# Patient Record
Sex: Male | Born: 1950 | Race: White | Hispanic: No | Marital: Married
Health system: Southern US, Community
[De-identification: ages and names within clinical notes are randomized; demographics above are authoritative.]

---

## 2001-04-05 ENCOUNTER — Encounter: Payer: Self-pay | Admitting: *Deleted

## 2001-04-05 ENCOUNTER — Emergency Department (HOSPITAL_COMMUNITY): Admission: EM | Admit: 2001-04-05 | Discharge: 2001-04-05 | Payer: Self-pay | Admitting: *Deleted

## 2001-04-18 ENCOUNTER — Emergency Department (HOSPITAL_COMMUNITY): Admission: EM | Admit: 2001-04-18 | Discharge: 2001-04-18 | Payer: Self-pay | Admitting: Emergency Medicine

## 2001-04-20 ENCOUNTER — Encounter: Payer: Self-pay | Admitting: Emergency Medicine

## 2001-04-20 ENCOUNTER — Ambulatory Visit (HOSPITAL_COMMUNITY): Admission: RE | Admit: 2001-04-20 | Discharge: 2001-04-20 | Payer: Self-pay | Admitting: Emergency Medicine

## 2001-05-13 ENCOUNTER — Observation Stay (HOSPITAL_COMMUNITY): Admission: RE | Admit: 2001-05-13 | Discharge: 2001-05-14 | Payer: Self-pay | Admitting: *Deleted

## 2016-09-06 ENCOUNTER — Other Ambulatory Visit: Payer: Self-pay | Admitting: Gastroenterology

## 2016-10-15 ENCOUNTER — Encounter (HOSPITAL_COMMUNITY): Payer: Self-pay

## 2016-10-15 ENCOUNTER — Ambulatory Visit (HOSPITAL_COMMUNITY): Admit: 2016-10-15 | Payer: Self-pay | Admitting: Gastroenterology

## 2016-10-15 SURGERY — COLONOSCOPY WITH PROPOFOL
Anesthesia: Monitor Anesthesia Care

## 2017-06-20 ENCOUNTER — Encounter: Payer: Self-pay | Admitting: *Deleted

## 2017-06-20 ENCOUNTER — Other Ambulatory Visit: Payer: Self-pay | Admitting: *Deleted

## 2017-06-20 DIAGNOSIS — E119 Type 2 diabetes mellitus without complications: Secondary | ICD-10-CM | POA: Insufficient documentation

## 2017-06-20 NOTE — Patient Outreach (Signed)
[facility] Screening call, unsuccessful but left a message for a return call. If I do not hear back from the member I will try again the week of Sept. 24th.  Eulah Pont. Myrtie Neither, MSN, [facility] Gerontological Nurse Practitioner [facility] Care Management [phone]

## 2017-06-20 NOTE — Patient Outreach (Signed)
Pt returned my call. He sees Dr. Delfina Redwood. Has had his CPE this year with prior insurance and will have follow up with Dr. Delfina Redwood in October. No current care management needs. Sent introductory letter.  Eulah Pont. Myrtie Neither, MSN, [facility] Gerontological Nurse Practitioner [facility] Care Management [phone]

## 2017-07-01 DIAGNOSIS — R109 Unspecified abdominal pain: Secondary | ICD-10-CM | POA: Diagnosis not present

## 2017-08-01 DIAGNOSIS — M109 Gout, unspecified: Secondary | ICD-10-CM | POA: Diagnosis not present

## 2017-08-01 DIAGNOSIS — K635 Polyp of colon: Secondary | ICD-10-CM | POA: Diagnosis not present

## 2017-08-01 DIAGNOSIS — E1122 Type 2 diabetes mellitus with diabetic chronic kidney disease: Secondary | ICD-10-CM | POA: Diagnosis not present

## 2017-08-01 DIAGNOSIS — Z23 Encounter for immunization: Secondary | ICD-10-CM | POA: Diagnosis not present

## 2017-08-01 DIAGNOSIS — E7800 Pure hypercholesterolemia, unspecified: Secondary | ICD-10-CM | POA: Diagnosis not present

## 2017-10-24 DIAGNOSIS — K6289 Other specified diseases of anus and rectum: Secondary | ICD-10-CM | POA: Diagnosis not present

## 2017-10-24 DIAGNOSIS — D126 Benign neoplasm of colon, unspecified: Secondary | ICD-10-CM | POA: Diagnosis not present

## 2017-10-24 DIAGNOSIS — Z86010 Personal history of colonic polyps: Secondary | ICD-10-CM | POA: Diagnosis not present

## 2017-10-28 DIAGNOSIS — D126 Benign neoplasm of colon, unspecified: Secondary | ICD-10-CM | POA: Diagnosis not present

## 2018-02-20 DIAGNOSIS — Z0000 Encounter for general adult medical examination without abnormal findings: Secondary | ICD-10-CM | POA: Diagnosis not present

## 2018-02-20 DIAGNOSIS — E663 Overweight: Secondary | ICD-10-CM | POA: Diagnosis not present

## 2018-02-20 DIAGNOSIS — Z1389 Encounter for screening for other disorder: Secondary | ICD-10-CM | POA: Diagnosis not present

## 2018-02-20 DIAGNOSIS — R194 Change in bowel habit: Secondary | ICD-10-CM | POA: Diagnosis not present

## 2018-02-20 DIAGNOSIS — E7800 Pure hypercholesterolemia, unspecified: Secondary | ICD-10-CM | POA: Diagnosis not present

## 2018-02-20 DIAGNOSIS — E1122 Type 2 diabetes mellitus with diabetic chronic kidney disease: Secondary | ICD-10-CM | POA: Diagnosis not present

## 2018-02-20 DIAGNOSIS — M109 Gout, unspecified: Secondary | ICD-10-CM | POA: Diagnosis not present

## 2018-02-20 DIAGNOSIS — Z125 Encounter for screening for malignant neoplasm of prostate: Secondary | ICD-10-CM | POA: Diagnosis not present

## 2018-02-20 DIAGNOSIS — N182 Chronic kidney disease, stage 2 (mild): Secondary | ICD-10-CM | POA: Diagnosis not present

## 2018-03-31 ENCOUNTER — Emergency Department (HOSPITAL_COMMUNITY): Payer: PPO

## 2018-03-31 ENCOUNTER — Emergency Department (HOSPITAL_COMMUNITY)
Admission: EM | Admit: 2018-03-31 | Discharge: 2018-03-31 | Disposition: A | Discharge status: Home / Self Care | Payer: PPO | Attending: Emergency Medicine | Admitting: Emergency Medicine

## 2018-03-31 ENCOUNTER — Other Ambulatory Visit: Payer: Self-pay

## 2018-03-31 DIAGNOSIS — Z7984 Long term (current) use of oral hypoglycemic drugs: Secondary | ICD-10-CM | POA: Insufficient documentation

## 2018-03-31 DIAGNOSIS — N189 Chronic kidney disease, unspecified: Secondary | ICD-10-CM | POA: Diagnosis not present

## 2018-03-31 DIAGNOSIS — R112 Nausea with vomiting, unspecified: Secondary | ICD-10-CM | POA: Diagnosis not present

## 2018-03-31 DIAGNOSIS — Z7982 Long term (current) use of aspirin: Secondary | ICD-10-CM | POA: Insufficient documentation

## 2018-03-31 DIAGNOSIS — Z79899 Other long term (current) drug therapy: Secondary | ICD-10-CM | POA: Insufficient documentation

## 2018-03-31 DIAGNOSIS — R197 Diarrhea, unspecified: Secondary | ICD-10-CM | POA: Diagnosis not present

## 2018-03-31 DIAGNOSIS — R109 Unspecified abdominal pain: Secondary | ICD-10-CM | POA: Insufficient documentation

## 2018-03-31 DIAGNOSIS — N201 Calculus of ureter: Secondary | ICD-10-CM | POA: Diagnosis not present

## 2018-03-31 DIAGNOSIS — E1122 Type 2 diabetes mellitus with diabetic chronic kidney disease: Secondary | ICD-10-CM | POA: Diagnosis not present

## 2018-03-31 DIAGNOSIS — R1111 Vomiting without nausea: Secondary | ICD-10-CM | POA: Diagnosis not present

## 2018-03-31 LAB — CBC WITH DIFFERENTIAL/PLATELET
Abs Immature Granulocytes: 0.1 10*3/uL (ref 0.0–0.1)
Basophils Absolute: 0.1 10*3/uL (ref 0.0–0.1)
Basophils Relative: 1 %
EOS ABS: 0.2 10*3/uL (ref 0.0–0.7)
Eosinophils Relative: 2 %
HCT: 45.5 % (ref 39.0–52.0)
HEMOGLOBIN: 14.4 g/dL (ref 13.0–17.0)
Immature Granulocytes: 1 %
Lymphocytes Relative: 19 %
Lymphs Abs: 1.7 10*3/uL (ref 0.7–4.0)
MCH: 28 pg (ref 26.0–34.0)
MCHC: 31.6 g/dL (ref 30.0–36.0)
MCV: 88.3 fL (ref 78.0–100.0)
Monocytes Absolute: 0.6 10*3/uL (ref 0.1–1.0)
Monocytes Relative: 7 %
NEUTROS ABS: 6.4 10*3/uL (ref 1.7–7.7)
NEUTROS PCT: 70 %
Platelets: 273 10*3/uL (ref 150–400)
RBC: 5.15 MIL/uL (ref 4.22–5.81)
RDW: 13.7 % (ref 11.5–15.5)
WBC: 9 10*3/uL (ref 4.0–10.5)

## 2018-03-31 LAB — LIPASE, BLOOD: Lipase: 25 U/L (ref 11–51)

## 2018-03-31 LAB — URINALYSIS, ROUTINE W REFLEX MICROSCOPIC
Bacteria, UA: NONE SEEN
Bilirubin Urine: NEGATIVE
Glucose, UA: NEGATIVE mg/dL
KETONES UR: NEGATIVE mg/dL
Leukocytes, UA: NEGATIVE
Nitrite: NEGATIVE
PH: 5 (ref 5.0–8.0)
PROTEIN: NEGATIVE mg/dL
Specific Gravity, Urine: 1.021 (ref 1.005–1.030)

## 2018-03-31 LAB — BASIC METABOLIC PANEL
ANION GAP: 13 (ref 5–15)
BUN: 13 mg/dL (ref 8–23)
CALCIUM: 9.7 mg/dL (ref 8.9–10.3)
CO2: 25 mmol/L (ref 22–32)
Chloride: 103 mmol/L (ref 98–111)
Creatinine, Ser: 1.59 mg/dL — ABNORMAL HIGH (ref 0.61–1.24)
GFR, EST AFRICAN AMERICAN: 51 mL/min — AB (ref >60)
GFR, EST NON AFRICAN AMERICAN: 44 mL/min — AB (ref >60)
Glucose, Bld: 205 mg/dL — ABNORMAL HIGH (ref 70–99)
POTASSIUM: 3.7 mmol/L (ref 3.5–5.1)
Sodium: 141 mmol/L (ref 135–145)

## 2018-03-31 MED ORDER — ONDANSETRON 4 MG PO TBDP: 4.0000 mg | ORAL_TABLET | Freq: Three times a day (TID) | ORAL | 0 refills | Status: AC | PRN

## 2018-03-31 MED ORDER — TAMSULOSIN HCL 0.4 MG PO CAPS: 0.4000 mg | ORAL_CAPSULE | Freq: Every day | ORAL | 0 refills | Status: AC

## 2018-03-31 MED ORDER — OXYCODONE-ACETAMINOPHEN 5-325 MG PO TABS: 1.0000 | ORAL_TABLET | Freq: Four times a day (QID) | ORAL | 0 refills | Status: AC | PRN

## 2018-03-31 NOTE — ED Notes (Signed)
ED Provider at bedside. 

## 2018-03-31 NOTE — ED Provider Notes (Signed)
Patient placed in Quick Look pathway, seen and evaluated   Chief Complaint: Concern for food posioning  HPI:   Patient went to dinner, with wife, they are both sick, he had some diarrhea and some left sided flank pain.  He Reports feeling nauseous and has thrown up.    ROS: Flank pain has improved  Physical Exam:   Gen: No distress  Neuro: Awake and Alert  Skin: Warm    Focused Exam: Abdomen is soft, non tender, non distended.     Initiation of care has begun. The patient has been counseled on the process, plan, and necessity for staying for the completion/evaluation, and the remainder of the medical screening examination    Ollen Gross 03/31/18 2011    Hayden Rasmussen, MD 04/01/18 [phone]

## 2018-03-31 NOTE — ED Notes (Signed)
Discharge instructions/prescriptions reviewed with pt/family, family denied any other needs/requests

## 2018-03-31 NOTE — ED Provider Notes (Signed)
[facility] EMERGENCY DEPARTMENT Provider Note   CSN: [id number] Arrival date & time: 03/31/18  1952    History   Chief Complaint Chief Complaint  Patient presents with  . Flank Pain    HPI Carlos Henderson is a 67 y.o. male.  67 year old male with a history of CKD and DM (on Metformin) presents to the emergency department for evaluation of right-sided flank pain.  Pain began shortly after heading home from dinner with his wife.  Pain radiated from his right mid back down to his right buttock.  He developed nausea with one episode of vomiting.  He had a small bowel movement as well which was consistent with diarrhea.  He denies taking any medications for his pain.  No associated fevers, dysuria, hematuria.  Abdominal surgical history significant for cholecystectomy.  Denies prior history of kidney stones.  States that he is asymptomatic at this time.  The history is provided by the patient. No language interpreter was used.  Flank Pain  This is a new problem. The current episode started 3 to 5 hours ago. The problem occurs constantly. The problem has been resolved. Nothing aggravates the symptoms. Nothing relieves the symptoms. He has tried nothing for the symptoms.    No past medical history on file.  Patient Active Problem List   Diagnosis Date Noted  . Diabetes ([place]) 06/20/2017    ** The histories are not reviewed yet. Please review them in the "History" navigator section and refresh this [place].      Home Medications    Prior to Admission medications   Medication Sig Start Date End Date Taking? Authorizing Provider  aspirin EC 81 MG tablet Take 81 mg by mouth daily.    [provider]  metFORMIN (GLUCOPHAGE) 1000 MG tablet Take 1,000 mg by mouth 2 (two) times daily with a meal.    [provider]  ondansetron (ZOFRAN ODT) 4 MG disintegrating tablet Take 1 tablet (4 mg total) by mouth every 8 (eight) hours as needed for nausea or  vomiting. 03/31/18   Antonietta Breach, PA-C  oxyCODONE-acetaminophen (PERCOCET/ROXICET) 5-325 MG tablet Take 1-2 tablets by mouth every 6 (six) hours as needed for severe pain. 03/31/18   Antonietta Breach, PA-C  tamsulosin (FLOMAX) 0.4 MG CAPS capsule Take 1 capsule (0.4 mg total) by mouth daily. 03/31/18   Antonietta Breach, PA-C    Family History No family history on file.  Social History Social History   Tobacco Use  . Smoking status: Not on file  Substance Use Topics  . Alcohol use: Not on file  . Drug use: Not on file     Allergies   Patient has no allergy information on record.   Review of Systems Review of Systems  Genitourinary: Positive for flank pain.   Ten systems reviewed and are negative for acute change, except as noted in the HPI.    Physical Exam Updated Vital Signs BP 138/63   Pulse 60   Temp 97.7 F (36.5 C) (Oral)   Resp 16   Ht 5\' 9"  (1.753 m)   Wt 86.2 kg (190 lb)   SpO2 97%   BMI 28.06 kg/m   Physical Exam  Constitutional: He is oriented to person, place, and time. He appears well-developed and well-nourished. No distress.  Nontoxic appearing and in NAD  HENT:  Head: Normocephalic and atraumatic.  Eyes: Conjunctivae and EOM are normal. No scleral icterus.  Neck: Normal range of motion.  Cardiovascular: Normal rate, regular  rhythm and intact distal pulses.  Pulmonary/Chest: Effort normal. No stridor. No respiratory distress. He has no wheezes. He has no rales.  Lungs CTAB  Abdominal: Soft. He exhibits no distension and no mass. There is no tenderness. There is no guarding.  Soft, nontender, nondistended. No CVA TTP.  Musculoskeletal: Normal range of motion.  Neurological: He is alert and oriented to person, place, and time. He exhibits normal muscle tone. Coordination normal.  Skin: Skin is warm and dry. No rash noted. He is not diaphoretic. No erythema. No pallor.  Psychiatric: He has a normal mood and affect. His behavior is normal.  Nursing note and  vitals reviewed.    ED Treatments / Results  Labs (all labs ordered are listed, but only abnormal results are displayed) Labs Reviewed  URINALYSIS, ROUTINE W REFLEX MICROSCOPIC - Abnormal; Notable for the following components:      Result Value   Hgb urine dipstick LARGE (*)    All other components within normal limits  BASIC METABOLIC PANEL - Abnormal; Notable for the following components:   Glucose, Bld 205 (*)    Creatinine, Ser 1.59 (*)    GFR calc non Af Amer 44 (*)    GFR calc Af Amer 51 (*)    All other components within normal limits  CBC WITH DIFFERENTIAL/PLATELET  LIPASE, BLOOD    EKG None  Radiology Ct Renal Stone Study  Result Date: 03/31/2018 CLINICAL DATA:  67 year old male with right-sided flank pain and nausea vomiting. History of kidney stones. EXAM: CT ABDOMEN AND PELVIS WITHOUT CONTRAST TECHNIQUE: Multidetector CT imaging of the abdomen and pelvis was performed following the standard protocol without IV contrast. COMPARISON:  None. FINDINGS: Evaluation of this exam is limited in the absence of intravenous contrast. Lower chest: The visualized lung bases are clear. No intra-abdominal free air or free fluid. Hepatobiliary: Cholecystectomy. The liver is unremarkable. No intrahepatic biliary ductal dilatation. Pancreas: Unremarkable. No pancreatic ductal dilatation or surrounding inflammatory changes. Spleen: Normal in size without focal abnormality. Adrenals/Urinary Tract: The adrenal glands are unremarkable. There is a 1 cm nonobstructing stone in the upper pole of the right kidney. No hydronephrosis. The left kidney is unremarkable. There is a punctate stone in the distal right ureter without evidence of obstruction. The left ureter is unremarkable. The urinary bladder is collapsed. Stomach/Bowel: There is no bowel obstruction or active inflammation. The appendix is not visualized, possibly surgically absent. Vascular/Lymphatic: Mild aortoiliac atherosclerotic disease.  The abdominal aorta and IVC are otherwise grossly unremarkable on this noncontrast CT. No portal venous gas. There is no adenopathy. Reproductive: The prostate and seminal vesicles are grossly unremarkable. Other: Small fat containing umbilical hernia. Musculoskeletal: Degenerative changes of the spine. No acute osseous pathology. IMPRESSION: 1. A 1 cm nonobstructing stone in the upper pole of the right kidney and a punctate distal right ureteral stone. No hydronephrosis or hydroureter or evidence of obstruction. 2. No bowel obstruction or active inflammation. 3. Mild Aortic Atherosclerosis (ICD10-I70.0). Electronically Signed   By: Anner Crete M.D.   On: 03/31/2018 22:52    Procedures Procedures (including critical care time)  Medications Ordered in ED Medications - No data to display   Initial Impression / Assessment and Plan / ED Course  I have reviewed the triage vital signs and the nursing notes.  Pertinent labs & imaging results that were available during my care of the patient were reviewed by me and considered in my medical decision making (see chart for details).  Patient has been diagnosed with a kidney stone via CT. There is no evidence of significant hydronephrosis, vitals sign stable and the patient does not have irratractable vomiting. Patient will be discharged home with pain medications and has been advised to follow up with PCP. Return precautions discussed and provided. Patient discharged in stable condition with no unaddressed concerns.   Final Clinical Impressions(s) / ED Diagnoses   Final diagnoses:  Right flank pain  Ureterolithiasis    ED Discharge Orders        Ordered    tamsulosin (FLOMAX) 0.4 MG CAPS capsule  Daily     03/31/18 2308    oxyCODONE-acetaminophen (PERCOCET/ROXICET) 5-325 MG tablet  Every 6 hours PRN     03/31/18 2308    ondansetron (ZOFRAN ODT) 4 MG disintegrating tablet  Every 8 hours PRN     03/31/18 2308       Antonietta Breach,  PA-C 03/31/18 2312    Dorie Rank, MD 03/31/18 [phone]

## 2018-03-31 NOTE — ED Triage Notes (Signed)
Pt states he had just eaten dinner with his wife and began having right-sided flank pain and vomiting. One episode of diarrhea. During triage process wife also began to vomit so they stated they may now suspect something they ate.  Pt in NAD at this time.  Wife advised to check in if she feels like she needs to be seen but wishes to hold off for now.

## 2018-03-31 NOTE — ED Notes (Signed)
Patient transported to CT 

## 2018-03-31 NOTE — Discharge Instructions (Signed)
Your found to have a very small stone in your right ureter as well as a larger stone in your right kidney.  The stone in your ureter is responsible for your pain and should pass in the next few days.  We recommend that you follow-up with urology as needed.  You may otherwise see your primary care doctor.  Take Flomax to promote stone movement and Percocet as needed for pain control.  You may use Zofran for nausea as prescribed.  Return to the ED for new or concerning symptoms.

## 2018-04-27 DIAGNOSIS — Z7984 Long term (current) use of oral hypoglycemic drugs: Secondary | ICD-10-CM | POA: Diagnosis not present

## 2018-04-27 DIAGNOSIS — E0822 Diabetes mellitus due to underlying condition with diabetic chronic kidney disease: Secondary | ICD-10-CM | POA: Diagnosis not present

## 2018-04-27 DIAGNOSIS — N529 Male erectile dysfunction, unspecified: Secondary | ICD-10-CM | POA: Diagnosis not present

## 2018-04-27 DIAGNOSIS — R197 Diarrhea, unspecified: Secondary | ICD-10-CM | POA: Diagnosis not present

## 2018-04-27 DIAGNOSIS — N200 Calculus of kidney: Secondary | ICD-10-CM | POA: Diagnosis not present

## 2018-04-27 DIAGNOSIS — N182 Chronic kidney disease, stage 2 (mild): Secondary | ICD-10-CM | POA: Diagnosis not present

## 2018-08-24 DIAGNOSIS — Z7984 Long term (current) use of oral hypoglycemic drugs: Secondary | ICD-10-CM | POA: Diagnosis not present

## 2018-08-24 DIAGNOSIS — N529 Male erectile dysfunction, unspecified: Secondary | ICD-10-CM | POA: Diagnosis not present

## 2018-08-24 DIAGNOSIS — Z23 Encounter for immunization: Secondary | ICD-10-CM | POA: Diagnosis not present

## 2018-08-24 DIAGNOSIS — E1169 Type 2 diabetes mellitus with other specified complication: Secondary | ICD-10-CM | POA: Diagnosis not present

## 2018-08-24 DIAGNOSIS — E1165 Type 2 diabetes mellitus with hyperglycemia: Secondary | ICD-10-CM | POA: Diagnosis not present

## 2018-12-20 IMAGING — CT CT RENAL STONE PROTOCOL
2 of 4 series · 16 of 46 positions shown, 18 images · non-contrast
Comparison: None.

CLINICAL DATA: 66-year-old male with right-sided flank pain and
nausea vomiting. History of kidney stones.

EXAM:
CT ABDOMEN AND PELVIS WITHOUT CONTRAST
TECHNIQUE: Multidetector CT imaging of the abdomen and pelvis was performed
following the standard protocol without IV contrast.

[Series 3: ap without · axial · non-contrast · 0.79mm/px · z∈[+820,+1270]mm · 13 of 102 slices shown, 15 images]
[im 6/102  soft-tissue]
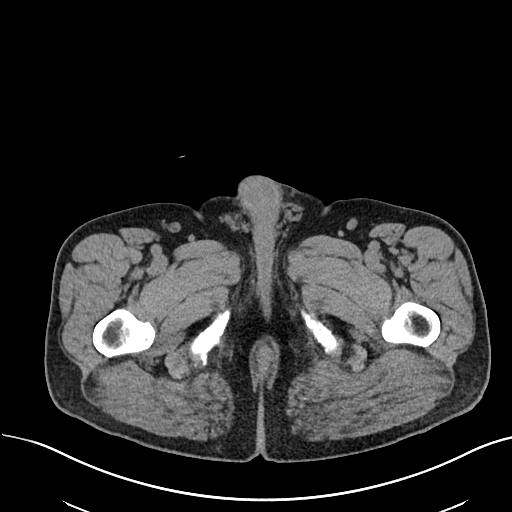
[im 6/102  bone]
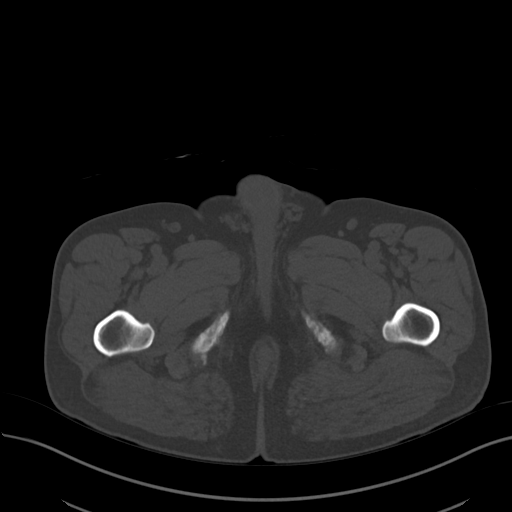
[im 16/102  soft-tissue]
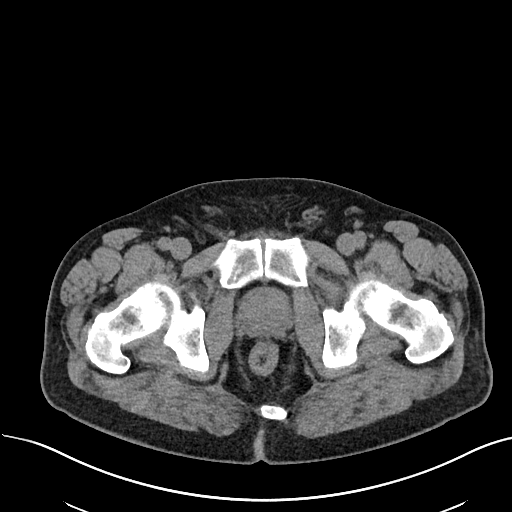
[im 22/102  soft-tissue]
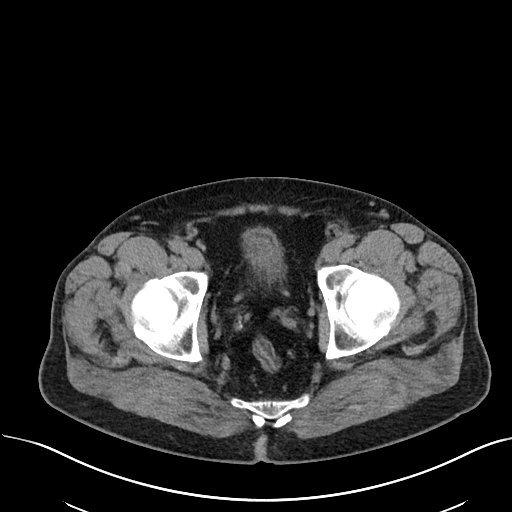
[im 27/102  soft-tissue]
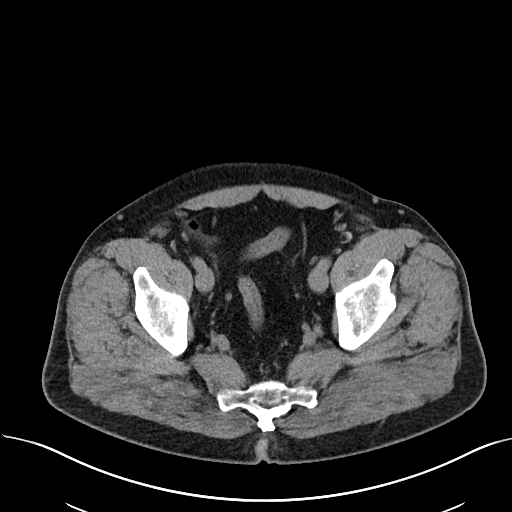
[im 38/102  soft-tissue]
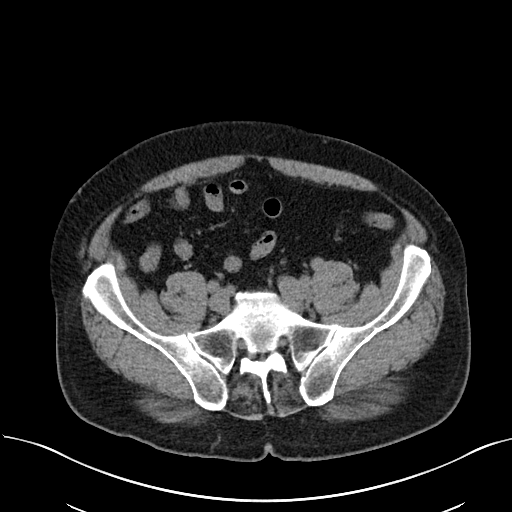
[im 43/102  soft-tissue]
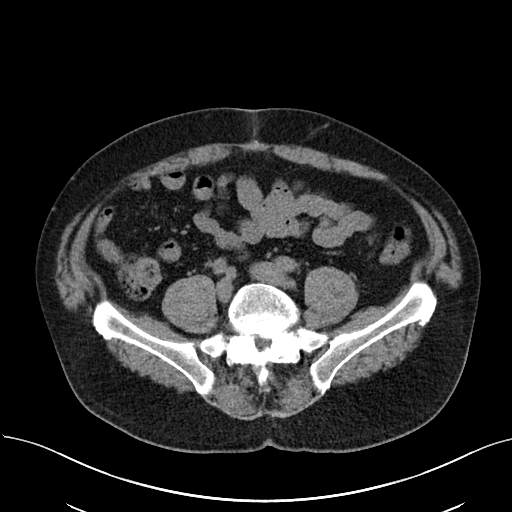
[im 54/102  soft-tissue]
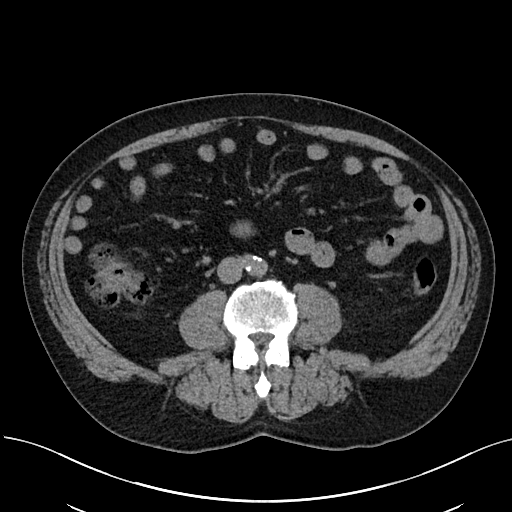
[im 59/102  soft-tissue]
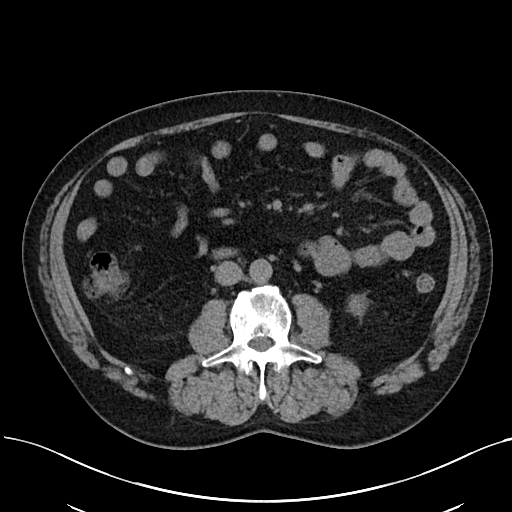
[im 64/102  soft-tissue]
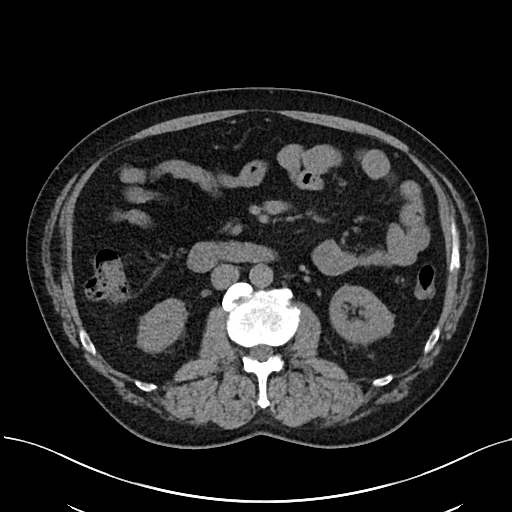
[im 64/102  bone]
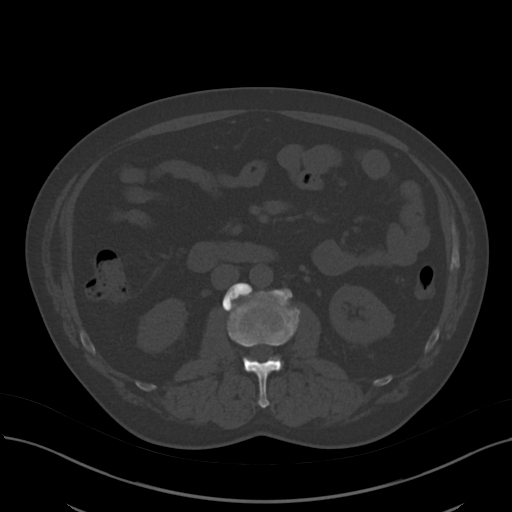
[im 75/102  soft-tissue]
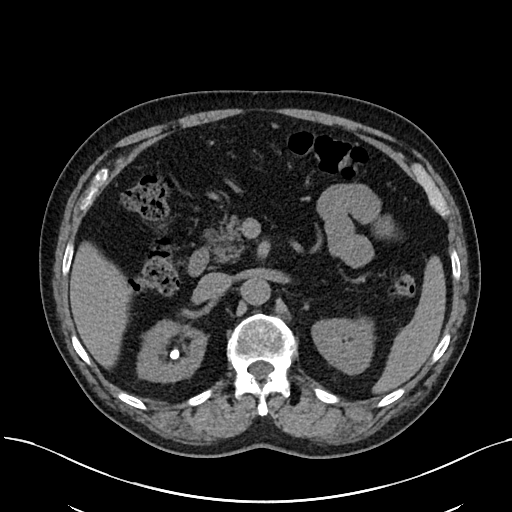
[im 80/102  soft-tissue]
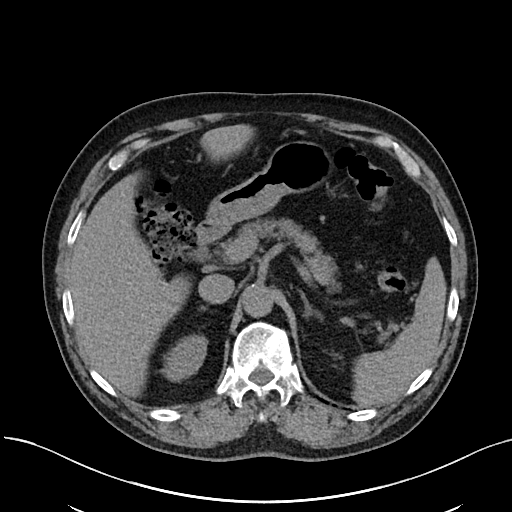
[im 86/102  soft-tissue]
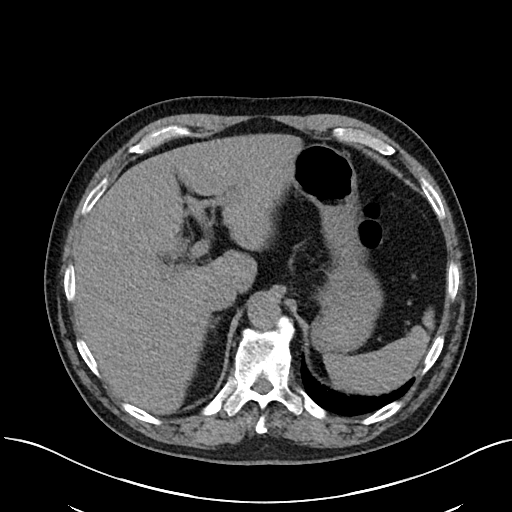
[im 96/102  soft-tissue]
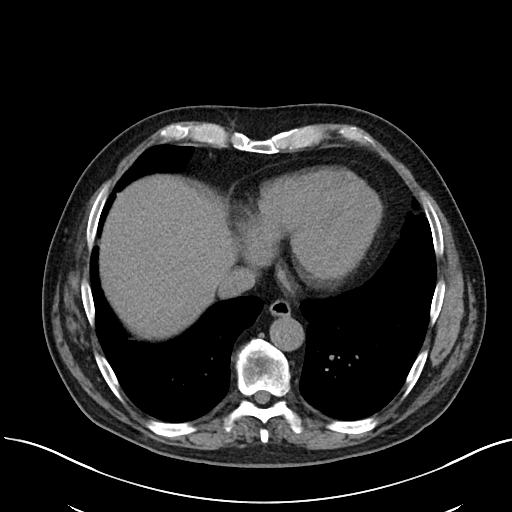

[Series 6: cor · coronal · 0.82mm/px · 3 of 107 slices shown]
[im 36/107  soft-tissue]
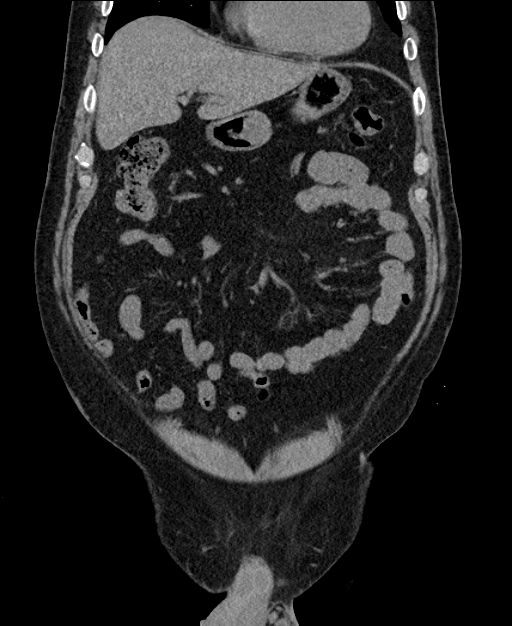
[im 48/107  soft-tissue]
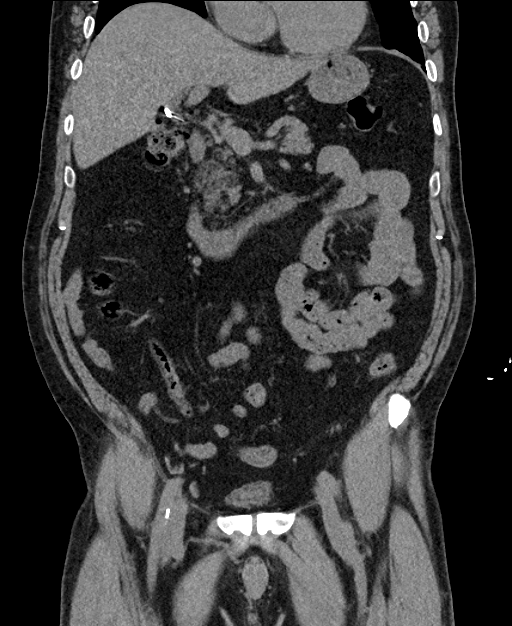
[im 59/107  soft-tissue]
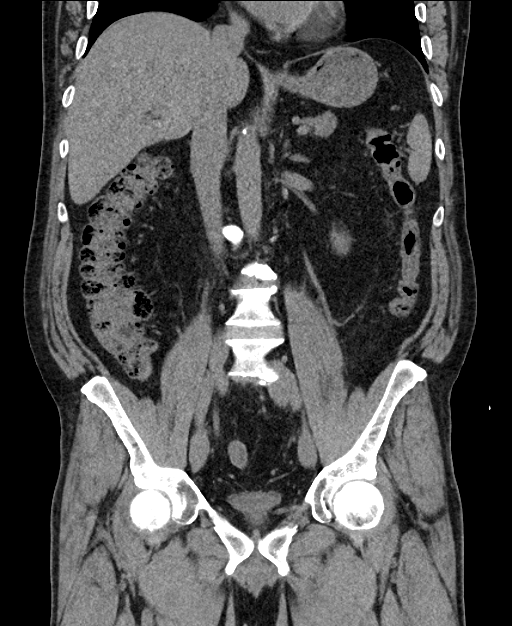

[16 of 46 positions shown; findings below may reference images not displayed]

FINDINGS: Evaluation of this exam is limited in the absence of intravenous
contrast.

Lower chest: The visualized lung bases are clear.

No intra-abdominal free air or free fluid.

Hepatobiliary: Cholecystectomy. The liver is unremarkable. No
intrahepatic biliary ductal dilatation.

Pancreas: Unremarkable. No pancreatic ductal dilatation or
surrounding inflammatory changes.

Spleen: Normal in size without focal abnormality.

Adrenals/Urinary Tract: The adrenal glands are unremarkable. There
is a 1 cm nonobstructing stone in the upper pole of the right
kidney. No hydronephrosis. The left kidney is unremarkable. There is
a punctate stone in the distal right ureter without evidence of
obstruction. The left ureter is unremarkable. The urinary bladder is
collapsed.

Stomach/Bowel: There is no bowel obstruction or active inflammation.
The appendix is not visualized, possibly surgically absent.

Vascular/Lymphatic: Mild aortoiliac atherosclerotic disease. The
abdominal aorta and IVC are otherwise grossly unremarkable on this
noncontrast CT. No portal venous gas. There is no adenopathy.

Reproductive: The prostate and seminal vesicles are grossly
unremarkable.

Other: Small fat containing umbilical hernia.

Musculoskeletal: Degenerative changes of the spine. No acute osseous
pathology.
IMPRESSION: 1. A 1 cm nonobstructing stone in the upper pole of the right kidney
and a punctate distal right ureteral stone. No hydronephrosis or
hydroureter or evidence of obstruction.
2. No bowel obstruction or active inflammation.
3. Mild Aortic Atherosclerosis ([id number]).

## 2019-01-25 DIAGNOSIS — E139 Other specified diabetes mellitus without complications: Secondary | ICD-10-CM | POA: Diagnosis not present

## 2019-01-25 DIAGNOSIS — E1169 Type 2 diabetes mellitus with other specified complication: Secondary | ICD-10-CM | POA: Diagnosis not present

## 2019-01-25 DIAGNOSIS — R109 Unspecified abdominal pain: Secondary | ICD-10-CM | POA: Diagnosis not present

## 2019-02-01 DIAGNOSIS — M9903 Segmental and somatic dysfunction of lumbar region: Secondary | ICD-10-CM | POA: Diagnosis not present

## 2019-02-01 DIAGNOSIS — M5137 Other intervertebral disc degeneration, lumbosacral region: Secondary | ICD-10-CM | POA: Diagnosis not present

## 2019-02-01 DIAGNOSIS — M5135 Other intervertebral disc degeneration, thoracolumbar region: Secondary | ICD-10-CM | POA: Diagnosis not present

## 2019-02-01 DIAGNOSIS — M9905 Segmental and somatic dysfunction of pelvic region: Secondary | ICD-10-CM | POA: Diagnosis not present

## 2019-02-01 DIAGNOSIS — M9904 Segmental and somatic dysfunction of sacral region: Secondary | ICD-10-CM | POA: Diagnosis not present

## 2019-02-01 DIAGNOSIS — Q72892 Other reduction defects of left lower limb: Secondary | ICD-10-CM | POA: Diagnosis not present

## 2019-02-02 DIAGNOSIS — M9903 Segmental and somatic dysfunction of lumbar region: Secondary | ICD-10-CM | POA: Diagnosis not present

## 2019-02-02 DIAGNOSIS — Q72892 Other reduction defects of left lower limb: Secondary | ICD-10-CM | POA: Diagnosis not present

## 2019-02-02 DIAGNOSIS — M9905 Segmental and somatic dysfunction of pelvic region: Secondary | ICD-10-CM | POA: Diagnosis not present

## 2019-02-02 DIAGNOSIS — M5137 Other intervertebral disc degeneration, lumbosacral region: Secondary | ICD-10-CM | POA: Diagnosis not present

## 2019-02-02 DIAGNOSIS — M9904 Segmental and somatic dysfunction of sacral region: Secondary | ICD-10-CM | POA: Diagnosis not present

## 2019-02-02 DIAGNOSIS — M5135 Other intervertebral disc degeneration, thoracolumbar region: Secondary | ICD-10-CM | POA: Diagnosis not present

## 2019-02-03 DIAGNOSIS — M9903 Segmental and somatic dysfunction of lumbar region: Secondary | ICD-10-CM | POA: Diagnosis not present

## 2019-02-03 DIAGNOSIS — M5137 Other intervertebral disc degeneration, lumbosacral region: Secondary | ICD-10-CM | POA: Diagnosis not present

## 2019-02-03 DIAGNOSIS — M9905 Segmental and somatic dysfunction of pelvic region: Secondary | ICD-10-CM | POA: Diagnosis not present

## 2019-02-03 DIAGNOSIS — M9904 Segmental and somatic dysfunction of sacral region: Secondary | ICD-10-CM | POA: Diagnosis not present

## 2019-02-03 DIAGNOSIS — Q72892 Other reduction defects of left lower limb: Secondary | ICD-10-CM | POA: Diagnosis not present

## 2019-02-03 DIAGNOSIS — M5135 Other intervertebral disc degeneration, thoracolumbar region: Secondary | ICD-10-CM | POA: Diagnosis not present

## 2019-02-05 DIAGNOSIS — Q72892 Other reduction defects of left lower limb: Secondary | ICD-10-CM | POA: Diagnosis not present

## 2019-02-05 DIAGNOSIS — M9905 Segmental and somatic dysfunction of pelvic region: Secondary | ICD-10-CM | POA: Diagnosis not present

## 2019-02-05 DIAGNOSIS — M5137 Other intervertebral disc degeneration, lumbosacral region: Secondary | ICD-10-CM | POA: Diagnosis not present

## 2019-02-05 DIAGNOSIS — M9903 Segmental and somatic dysfunction of lumbar region: Secondary | ICD-10-CM | POA: Diagnosis not present

## 2019-02-05 DIAGNOSIS — M9904 Segmental and somatic dysfunction of sacral region: Secondary | ICD-10-CM | POA: Diagnosis not present

## 2019-02-05 DIAGNOSIS — M5135 Other intervertebral disc degeneration, thoracolumbar region: Secondary | ICD-10-CM | POA: Diagnosis not present

## 2019-02-08 DIAGNOSIS — M5137 Other intervertebral disc degeneration, lumbosacral region: Secondary | ICD-10-CM | POA: Diagnosis not present

## 2019-02-08 DIAGNOSIS — M9904 Segmental and somatic dysfunction of sacral region: Secondary | ICD-10-CM | POA: Diagnosis not present

## 2019-02-08 DIAGNOSIS — M9905 Segmental and somatic dysfunction of pelvic region: Secondary | ICD-10-CM | POA: Diagnosis not present

## 2019-02-08 DIAGNOSIS — Q72892 Other reduction defects of left lower limb: Secondary | ICD-10-CM | POA: Diagnosis not present

## 2019-02-08 DIAGNOSIS — M9903 Segmental and somatic dysfunction of lumbar region: Secondary | ICD-10-CM | POA: Diagnosis not present

## 2019-02-08 DIAGNOSIS — M5135 Other intervertebral disc degeneration, thoracolumbar region: Secondary | ICD-10-CM | POA: Diagnosis not present

## 2019-02-10 DIAGNOSIS — M9904 Segmental and somatic dysfunction of sacral region: Secondary | ICD-10-CM | POA: Diagnosis not present

## 2019-02-10 DIAGNOSIS — M5135 Other intervertebral disc degeneration, thoracolumbar region: Secondary | ICD-10-CM | POA: Diagnosis not present

## 2019-02-10 DIAGNOSIS — Q72892 Other reduction defects of left lower limb: Secondary | ICD-10-CM | POA: Diagnosis not present

## 2019-02-10 DIAGNOSIS — M9905 Segmental and somatic dysfunction of pelvic region: Secondary | ICD-10-CM | POA: Diagnosis not present

## 2019-02-10 DIAGNOSIS — M9903 Segmental and somatic dysfunction of lumbar region: Secondary | ICD-10-CM | POA: Diagnosis not present

## 2019-02-10 DIAGNOSIS — M5137 Other intervertebral disc degeneration, lumbosacral region: Secondary | ICD-10-CM | POA: Diagnosis not present

## 2019-02-12 DIAGNOSIS — M9903 Segmental and somatic dysfunction of lumbar region: Secondary | ICD-10-CM | POA: Diagnosis not present

## 2019-02-12 DIAGNOSIS — Q72892 Other reduction defects of left lower limb: Secondary | ICD-10-CM | POA: Diagnosis not present

## 2019-02-12 DIAGNOSIS — M9904 Segmental and somatic dysfunction of sacral region: Secondary | ICD-10-CM | POA: Diagnosis not present

## 2019-02-12 DIAGNOSIS — M5135 Other intervertebral disc degeneration, thoracolumbar region: Secondary | ICD-10-CM | POA: Diagnosis not present

## 2019-02-12 DIAGNOSIS — M5137 Other intervertebral disc degeneration, lumbosacral region: Secondary | ICD-10-CM | POA: Diagnosis not present

## 2019-02-12 DIAGNOSIS — M9905 Segmental and somatic dysfunction of pelvic region: Secondary | ICD-10-CM | POA: Diagnosis not present

## 2019-02-15 DIAGNOSIS — Q72892 Other reduction defects of left lower limb: Secondary | ICD-10-CM | POA: Diagnosis not present

## 2019-02-15 DIAGNOSIS — M5135 Other intervertebral disc degeneration, thoracolumbar region: Secondary | ICD-10-CM | POA: Diagnosis not present

## 2019-02-15 DIAGNOSIS — M9903 Segmental and somatic dysfunction of lumbar region: Secondary | ICD-10-CM | POA: Diagnosis not present

## 2019-02-15 DIAGNOSIS — M5137 Other intervertebral disc degeneration, lumbosacral region: Secondary | ICD-10-CM | POA: Diagnosis not present

## 2019-02-15 DIAGNOSIS — M9905 Segmental and somatic dysfunction of pelvic region: Secondary | ICD-10-CM | POA: Diagnosis not present

## 2019-02-15 DIAGNOSIS — M9904 Segmental and somatic dysfunction of sacral region: Secondary | ICD-10-CM | POA: Diagnosis not present

## 2019-02-17 DIAGNOSIS — M9903 Segmental and somatic dysfunction of lumbar region: Secondary | ICD-10-CM | POA: Diagnosis not present

## 2019-02-17 DIAGNOSIS — Q72892 Other reduction defects of left lower limb: Secondary | ICD-10-CM | POA: Diagnosis not present

## 2019-02-17 DIAGNOSIS — M5137 Other intervertebral disc degeneration, lumbosacral region: Secondary | ICD-10-CM | POA: Diagnosis not present

## 2019-02-17 DIAGNOSIS — M9904 Segmental and somatic dysfunction of sacral region: Secondary | ICD-10-CM | POA: Diagnosis not present

## 2019-02-17 DIAGNOSIS — M9905 Segmental and somatic dysfunction of pelvic region: Secondary | ICD-10-CM | POA: Diagnosis not present

## 2019-02-17 DIAGNOSIS — M5135 Other intervertebral disc degeneration, thoracolumbar region: Secondary | ICD-10-CM | POA: Diagnosis not present

## 2019-02-19 DIAGNOSIS — M9904 Segmental and somatic dysfunction of sacral region: Secondary | ICD-10-CM | POA: Diagnosis not present

## 2019-02-19 DIAGNOSIS — M9905 Segmental and somatic dysfunction of pelvic region: Secondary | ICD-10-CM | POA: Diagnosis not present

## 2019-02-19 DIAGNOSIS — M5137 Other intervertebral disc degeneration, lumbosacral region: Secondary | ICD-10-CM | POA: Diagnosis not present

## 2019-02-19 DIAGNOSIS — M9903 Segmental and somatic dysfunction of lumbar region: Secondary | ICD-10-CM | POA: Diagnosis not present

## 2019-02-19 DIAGNOSIS — Q72892 Other reduction defects of left lower limb: Secondary | ICD-10-CM | POA: Diagnosis not present

## 2019-02-19 DIAGNOSIS — M5135 Other intervertebral disc degeneration, thoracolumbar region: Secondary | ICD-10-CM | POA: Diagnosis not present

## 2019-02-23 DIAGNOSIS — M5137 Other intervertebral disc degeneration, lumbosacral region: Secondary | ICD-10-CM | POA: Diagnosis not present

## 2019-02-23 DIAGNOSIS — M9903 Segmental and somatic dysfunction of lumbar region: Secondary | ICD-10-CM | POA: Diagnosis not present

## 2019-02-23 DIAGNOSIS — M5135 Other intervertebral disc degeneration, thoracolumbar region: Secondary | ICD-10-CM | POA: Diagnosis not present

## 2019-02-23 DIAGNOSIS — M9904 Segmental and somatic dysfunction of sacral region: Secondary | ICD-10-CM | POA: Diagnosis not present

## 2019-02-23 DIAGNOSIS — Q72892 Other reduction defects of left lower limb: Secondary | ICD-10-CM | POA: Diagnosis not present

## 2019-02-23 DIAGNOSIS — M9905 Segmental and somatic dysfunction of pelvic region: Secondary | ICD-10-CM | POA: Diagnosis not present

## 2019-02-25 DIAGNOSIS — M9904 Segmental and somatic dysfunction of sacral region: Secondary | ICD-10-CM | POA: Diagnosis not present

## 2019-02-25 DIAGNOSIS — M5135 Other intervertebral disc degeneration, thoracolumbar region: Secondary | ICD-10-CM | POA: Diagnosis not present

## 2019-02-25 DIAGNOSIS — Q72892 Other reduction defects of left lower limb: Secondary | ICD-10-CM | POA: Diagnosis not present

## 2019-02-25 DIAGNOSIS — M9905 Segmental and somatic dysfunction of pelvic region: Secondary | ICD-10-CM | POA: Diagnosis not present

## 2019-02-25 DIAGNOSIS — M9903 Segmental and somatic dysfunction of lumbar region: Secondary | ICD-10-CM | POA: Diagnosis not present

## 2019-02-25 DIAGNOSIS — M5137 Other intervertebral disc degeneration, lumbosacral region: Secondary | ICD-10-CM | POA: Diagnosis not present

## 2019-03-02 DIAGNOSIS — M9905 Segmental and somatic dysfunction of pelvic region: Secondary | ICD-10-CM | POA: Diagnosis not present

## 2019-03-02 DIAGNOSIS — Q72892 Other reduction defects of left lower limb: Secondary | ICD-10-CM | POA: Diagnosis not present

## 2019-03-02 DIAGNOSIS — M5135 Other intervertebral disc degeneration, thoracolumbar region: Secondary | ICD-10-CM | POA: Diagnosis not present

## 2019-03-02 DIAGNOSIS — M9904 Segmental and somatic dysfunction of sacral region: Secondary | ICD-10-CM | POA: Diagnosis not present

## 2019-03-02 DIAGNOSIS — M5137 Other intervertebral disc degeneration, lumbosacral region: Secondary | ICD-10-CM | POA: Diagnosis not present

## 2019-03-02 DIAGNOSIS — M9903 Segmental and somatic dysfunction of lumbar region: Secondary | ICD-10-CM | POA: Diagnosis not present

## 2019-03-04 DIAGNOSIS — M5135 Other intervertebral disc degeneration, thoracolumbar region: Secondary | ICD-10-CM | POA: Diagnosis not present

## 2019-03-04 DIAGNOSIS — M9903 Segmental and somatic dysfunction of lumbar region: Secondary | ICD-10-CM | POA: Diagnosis not present

## 2019-03-04 DIAGNOSIS — M5137 Other intervertebral disc degeneration, lumbosacral region: Secondary | ICD-10-CM | POA: Diagnosis not present

## 2019-03-04 DIAGNOSIS — M9905 Segmental and somatic dysfunction of pelvic region: Secondary | ICD-10-CM | POA: Diagnosis not present

## 2019-03-04 DIAGNOSIS — Q72892 Other reduction defects of left lower limb: Secondary | ICD-10-CM | POA: Diagnosis not present

## 2019-03-04 DIAGNOSIS — M9904 Segmental and somatic dysfunction of sacral region: Secondary | ICD-10-CM | POA: Diagnosis not present

## 2019-03-09 DIAGNOSIS — M9905 Segmental and somatic dysfunction of pelvic region: Secondary | ICD-10-CM | POA: Diagnosis not present

## 2019-03-09 DIAGNOSIS — M9904 Segmental and somatic dysfunction of sacral region: Secondary | ICD-10-CM | POA: Diagnosis not present

## 2019-03-09 DIAGNOSIS — M5135 Other intervertebral disc degeneration, thoracolumbar region: Secondary | ICD-10-CM | POA: Diagnosis not present

## 2019-03-09 DIAGNOSIS — M5137 Other intervertebral disc degeneration, lumbosacral region: Secondary | ICD-10-CM | POA: Diagnosis not present

## 2019-03-09 DIAGNOSIS — M9903 Segmental and somatic dysfunction of lumbar region: Secondary | ICD-10-CM | POA: Diagnosis not present

## 2019-03-09 DIAGNOSIS — Q72892 Other reduction defects of left lower limb: Secondary | ICD-10-CM | POA: Diagnosis not present

## 2019-03-11 DIAGNOSIS — Q72892 Other reduction defects of left lower limb: Secondary | ICD-10-CM | POA: Diagnosis not present

## 2019-03-11 DIAGNOSIS — M5135 Other intervertebral disc degeneration, thoracolumbar region: Secondary | ICD-10-CM | POA: Diagnosis not present

## 2019-03-11 DIAGNOSIS — M9903 Segmental and somatic dysfunction of lumbar region: Secondary | ICD-10-CM | POA: Diagnosis not present

## 2019-03-11 DIAGNOSIS — M9905 Segmental and somatic dysfunction of pelvic region: Secondary | ICD-10-CM | POA: Diagnosis not present

## 2019-03-11 DIAGNOSIS — M5137 Other intervertebral disc degeneration, lumbosacral region: Secondary | ICD-10-CM | POA: Diagnosis not present

## 2019-03-11 DIAGNOSIS — M9904 Segmental and somatic dysfunction of sacral region: Secondary | ICD-10-CM | POA: Diagnosis not present

## 2019-03-16 DIAGNOSIS — Q72892 Other reduction defects of left lower limb: Secondary | ICD-10-CM | POA: Diagnosis not present

## 2019-03-16 DIAGNOSIS — M9904 Segmental and somatic dysfunction of sacral region: Secondary | ICD-10-CM | POA: Diagnosis not present

## 2019-03-16 DIAGNOSIS — M9905 Segmental and somatic dysfunction of pelvic region: Secondary | ICD-10-CM | POA: Diagnosis not present

## 2019-03-16 DIAGNOSIS — M9903 Segmental and somatic dysfunction of lumbar region: Secondary | ICD-10-CM | POA: Diagnosis not present

## 2019-03-16 DIAGNOSIS — M5135 Other intervertebral disc degeneration, thoracolumbar region: Secondary | ICD-10-CM | POA: Diagnosis not present

## 2019-03-16 DIAGNOSIS — M5137 Other intervertebral disc degeneration, lumbosacral region: Secondary | ICD-10-CM | POA: Diagnosis not present

## 2019-03-18 DIAGNOSIS — M9904 Segmental and somatic dysfunction of sacral region: Secondary | ICD-10-CM | POA: Diagnosis not present

## 2019-03-18 DIAGNOSIS — Q72892 Other reduction defects of left lower limb: Secondary | ICD-10-CM | POA: Diagnosis not present

## 2019-03-18 DIAGNOSIS — M5137 Other intervertebral disc degeneration, lumbosacral region: Secondary | ICD-10-CM | POA: Diagnosis not present

## 2019-03-18 DIAGNOSIS — M9905 Segmental and somatic dysfunction of pelvic region: Secondary | ICD-10-CM | POA: Diagnosis not present

## 2019-03-18 DIAGNOSIS — M5135 Other intervertebral disc degeneration, thoracolumbar region: Secondary | ICD-10-CM | POA: Diagnosis not present

## 2019-03-18 DIAGNOSIS — M9903 Segmental and somatic dysfunction of lumbar region: Secondary | ICD-10-CM | POA: Diagnosis not present

## 2019-03-29 DIAGNOSIS — M5135 Other intervertebral disc degeneration, thoracolumbar region: Secondary | ICD-10-CM | POA: Diagnosis not present

## 2019-03-29 DIAGNOSIS — M9904 Segmental and somatic dysfunction of sacral region: Secondary | ICD-10-CM | POA: Diagnosis not present

## 2019-03-29 DIAGNOSIS — M5137 Other intervertebral disc degeneration, lumbosacral region: Secondary | ICD-10-CM | POA: Diagnosis not present

## 2019-03-29 DIAGNOSIS — Q72892 Other reduction defects of left lower limb: Secondary | ICD-10-CM | POA: Diagnosis not present

## 2019-03-29 DIAGNOSIS — M9905 Segmental and somatic dysfunction of pelvic region: Secondary | ICD-10-CM | POA: Diagnosis not present

## 2019-03-29 DIAGNOSIS — M9903 Segmental and somatic dysfunction of lumbar region: Secondary | ICD-10-CM | POA: Diagnosis not present

## 2019-04-01 DIAGNOSIS — Q72892 Other reduction defects of left lower limb: Secondary | ICD-10-CM | POA: Diagnosis not present

## 2019-04-01 DIAGNOSIS — M9905 Segmental and somatic dysfunction of pelvic region: Secondary | ICD-10-CM | POA: Diagnosis not present

## 2019-04-01 DIAGNOSIS — M9903 Segmental and somatic dysfunction of lumbar region: Secondary | ICD-10-CM | POA: Diagnosis not present

## 2019-04-01 DIAGNOSIS — M5135 Other intervertebral disc degeneration, thoracolumbar region: Secondary | ICD-10-CM | POA: Diagnosis not present

## 2019-04-01 DIAGNOSIS — M9904 Segmental and somatic dysfunction of sacral region: Secondary | ICD-10-CM | POA: Diagnosis not present

## 2019-04-01 DIAGNOSIS — M5137 Other intervertebral disc degeneration, lumbosacral region: Secondary | ICD-10-CM | POA: Diagnosis not present

## 2019-04-06 DIAGNOSIS — M5135 Other intervertebral disc degeneration, thoracolumbar region: Secondary | ICD-10-CM | POA: Diagnosis not present

## 2019-04-06 DIAGNOSIS — Q72892 Other reduction defects of left lower limb: Secondary | ICD-10-CM | POA: Diagnosis not present

## 2019-04-06 DIAGNOSIS — M9903 Segmental and somatic dysfunction of lumbar region: Secondary | ICD-10-CM | POA: Diagnosis not present

## 2019-04-06 DIAGNOSIS — M5137 Other intervertebral disc degeneration, lumbosacral region: Secondary | ICD-10-CM | POA: Diagnosis not present

## 2019-04-06 DIAGNOSIS — M9905 Segmental and somatic dysfunction of pelvic region: Secondary | ICD-10-CM | POA: Diagnosis not present

## 2019-04-06 DIAGNOSIS — M9904 Segmental and somatic dysfunction of sacral region: Secondary | ICD-10-CM | POA: Diagnosis not present

## 2019-07-13 ENCOUNTER — Other Ambulatory Visit: Payer: Self-pay | Admitting: Internal Medicine

## 2019-07-13 ENCOUNTER — Ambulatory Visit
Admission: RE | Admit: 2019-07-13 | Discharge: 2019-07-13 | Disposition: A | Discharge status: Home / Self Care | Payer: PPO | Source: Ambulatory Visit | Attending: Internal Medicine | Admitting: Internal Medicine

## 2019-07-13 DIAGNOSIS — Z23 Encounter for immunization: Secondary | ICD-10-CM | POA: Diagnosis not present

## 2019-07-13 DIAGNOSIS — M545 Low back pain, unspecified: Secondary | ICD-10-CM

## 2019-07-26 DIAGNOSIS — M545 Low back pain: Secondary | ICD-10-CM | POA: Diagnosis not present

## 2019-07-30 DIAGNOSIS — M2560 Stiffness of unspecified joint, not elsewhere classified: Secondary | ICD-10-CM | POA: Diagnosis not present

## 2019-07-30 DIAGNOSIS — M6281 Muscle weakness (generalized): Secondary | ICD-10-CM | POA: Diagnosis not present

## 2019-07-30 DIAGNOSIS — M47817 Spondylosis without myelopathy or radiculopathy, lumbosacral region: Secondary | ICD-10-CM | POA: Diagnosis not present

## 2019-07-30 DIAGNOSIS — M545 Low back pain: Secondary | ICD-10-CM | POA: Diagnosis not present

## 2019-08-06 DIAGNOSIS — M47817 Spondylosis without myelopathy or radiculopathy, lumbosacral region: Secondary | ICD-10-CM | POA: Diagnosis not present

## 2019-08-06 DIAGNOSIS — M2560 Stiffness of unspecified joint, not elsewhere classified: Secondary | ICD-10-CM | POA: Diagnosis not present

## 2019-08-06 DIAGNOSIS — M6281 Muscle weakness (generalized): Secondary | ICD-10-CM | POA: Diagnosis not present

## 2019-08-06 DIAGNOSIS — M545 Low back pain: Secondary | ICD-10-CM | POA: Diagnosis not present

## 2019-08-13 DIAGNOSIS — M6281 Muscle weakness (generalized): Secondary | ICD-10-CM | POA: Diagnosis not present

## 2019-08-13 DIAGNOSIS — M47817 Spondylosis without myelopathy or radiculopathy, lumbosacral region: Secondary | ICD-10-CM | POA: Diagnosis not present

## 2019-08-13 DIAGNOSIS — M545 Low back pain: Secondary | ICD-10-CM | POA: Diagnosis not present

## 2019-08-13 DIAGNOSIS — M2560 Stiffness of unspecified joint, not elsewhere classified: Secondary | ICD-10-CM | POA: Diagnosis not present

## 2019-08-30 DIAGNOSIS — M545 Low back pain: Secondary | ICD-10-CM | POA: Diagnosis not present

## 2020-04-02 IMAGING — DX DG LUMBAR SPINE 2-3V
3 series · 3 of 3 positions shown · non-contrast
Comparison: None.

CLINICAL DATA: Low back pain

EXAM:
LUMBAR SPINE - 3 VIEW

[dg lumbar spine 2-3 views (1 of 3)]
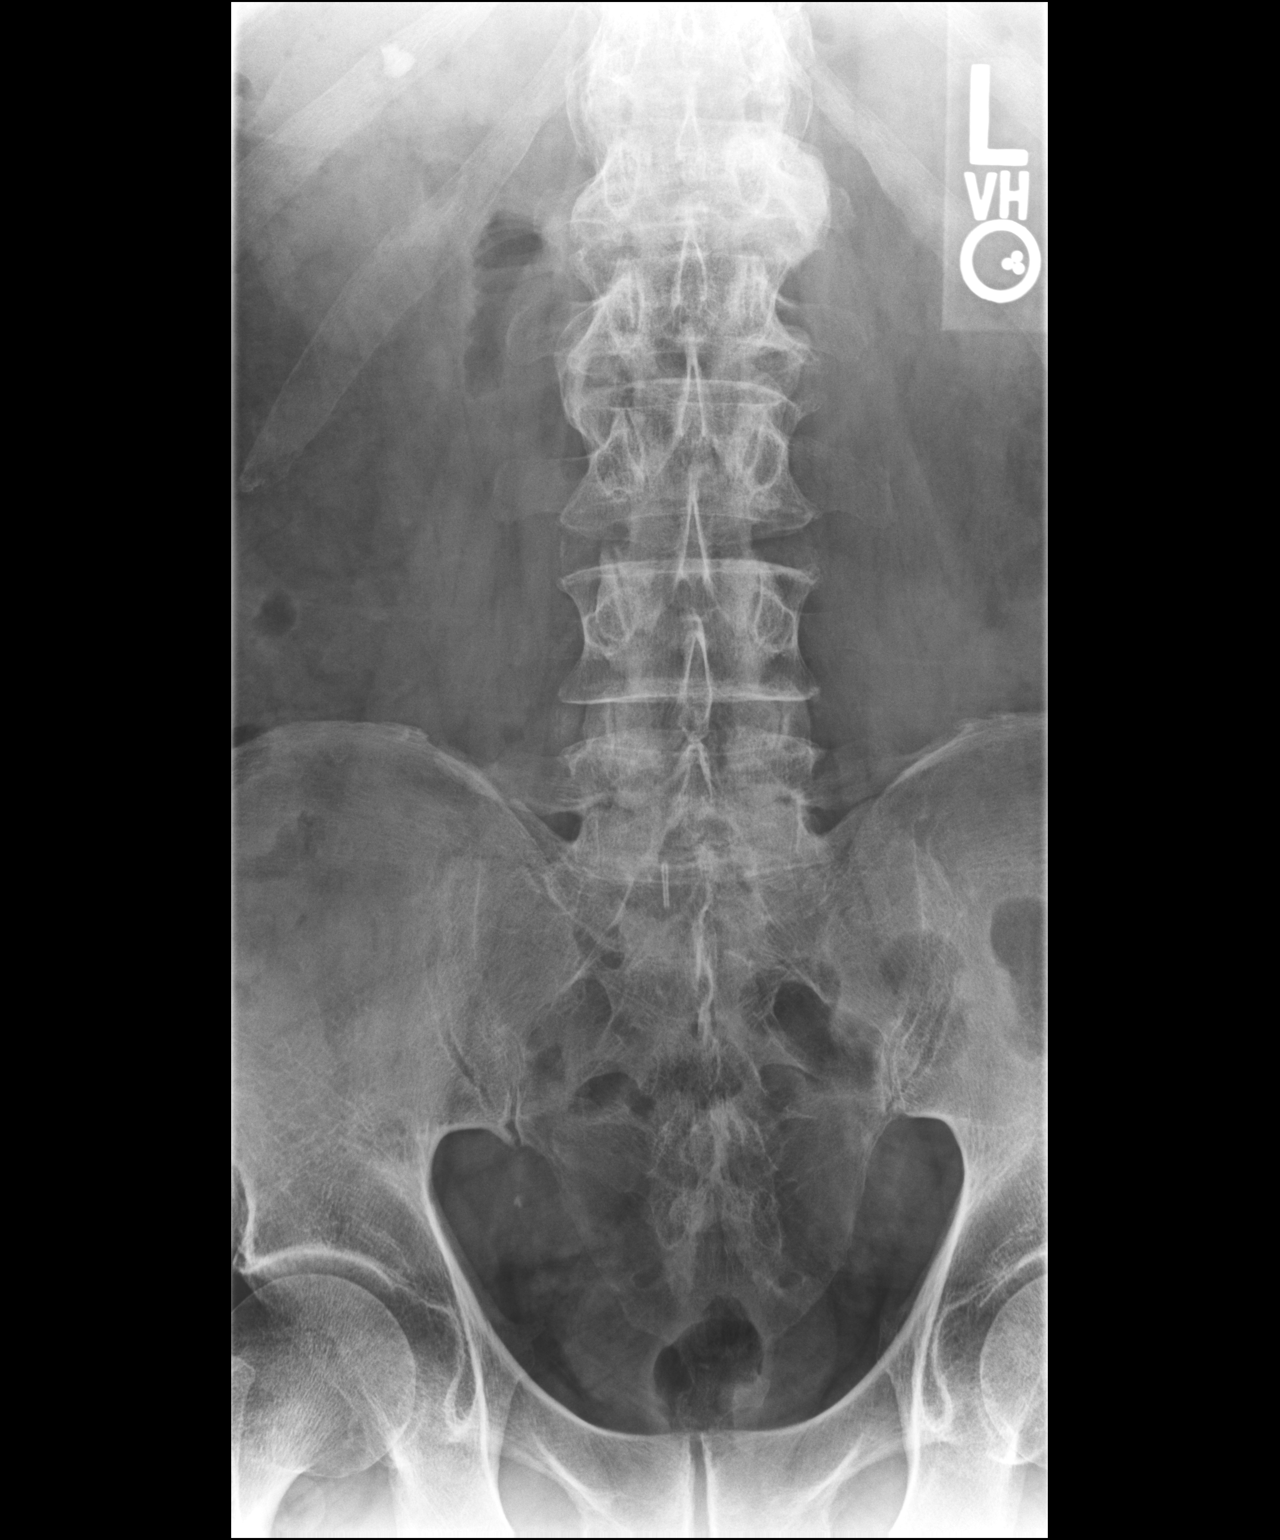

[dg lumbar spine 2-3 views (2 of 3)]
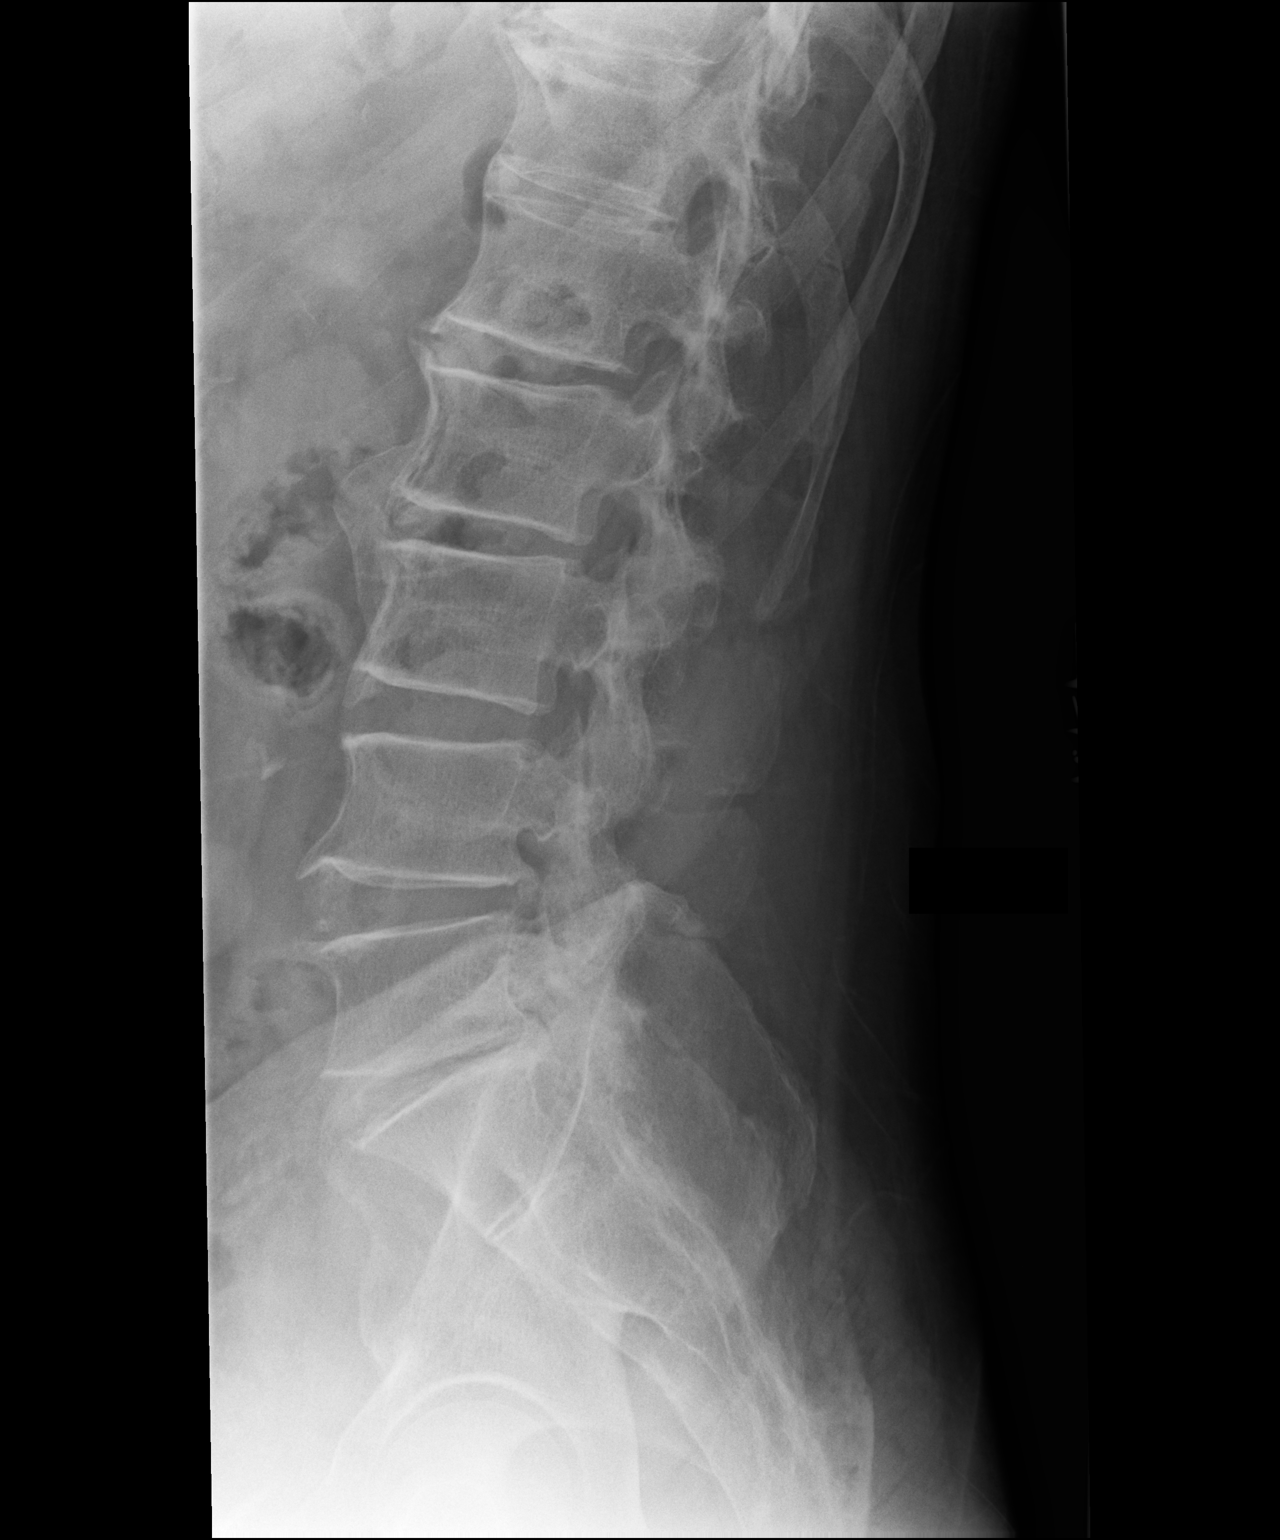

[dg lumbar spine 2-3 views (3 of 3)]
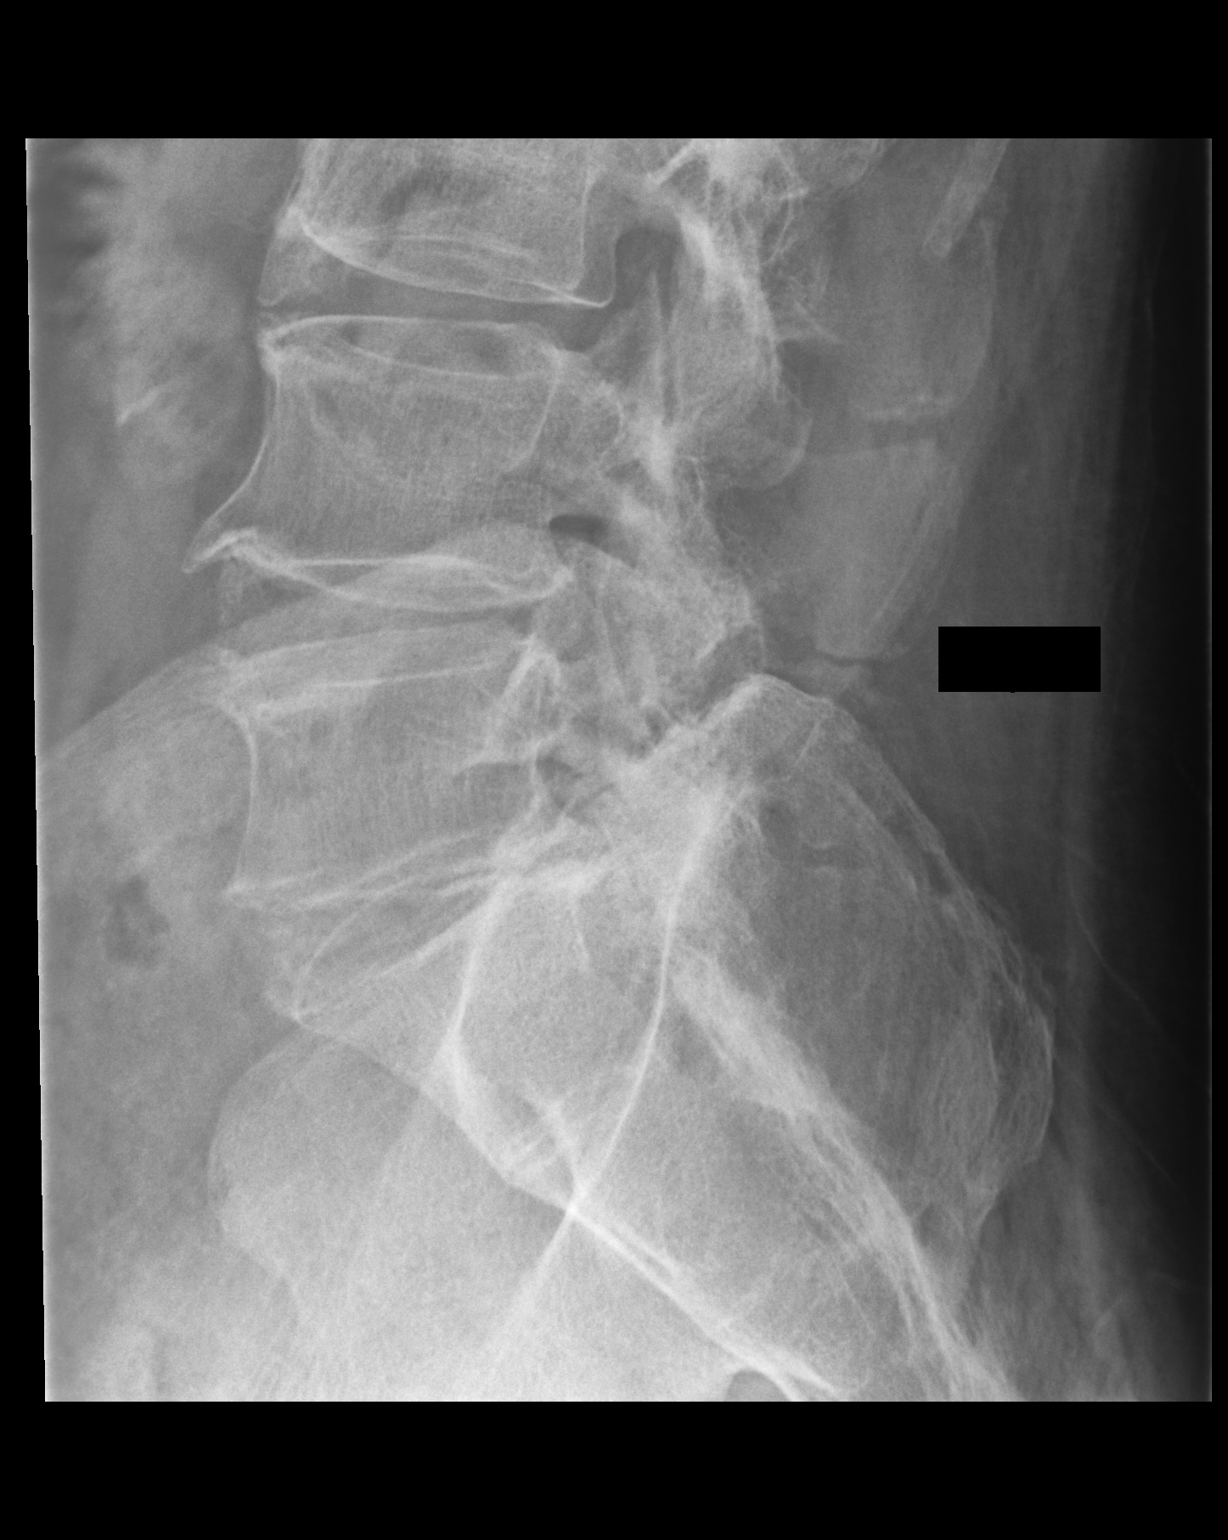

[3 of 3 positions shown; findings below may reference images not displayed]

FINDINGS: Five lumbar type vertebral bodies are well visualized. Vertebral
body height is well maintained. Disc space narrowing at L5-S1 is
noted. No soft tissue abnormality is noted.
IMPRESSION: Degenerative change without acute abnormality

## 2020-06-15 DIAGNOSIS — Z125 Encounter for screening for malignant neoplasm of prostate: Secondary | ICD-10-CM | POA: Diagnosis not present

## 2020-06-15 DIAGNOSIS — M109 Gout, unspecified: Secondary | ICD-10-CM | POA: Diagnosis not present

## 2020-06-15 DIAGNOSIS — Z7984 Long term (current) use of oral hypoglycemic drugs: Secondary | ICD-10-CM | POA: Diagnosis not present

## 2020-06-15 DIAGNOSIS — E1169 Type 2 diabetes mellitus with other specified complication: Secondary | ICD-10-CM | POA: Diagnosis not present

## 2020-06-15 DIAGNOSIS — E7800 Pure hypercholesterolemia, unspecified: Secondary | ICD-10-CM | POA: Diagnosis not present

## 2020-06-15 DIAGNOSIS — Z5181 Encounter for therapeutic drug level monitoring: Secondary | ICD-10-CM | POA: Diagnosis not present

## 2020-06-15 DIAGNOSIS — Z0000 Encounter for general adult medical examination without abnormal findings: Secondary | ICD-10-CM | POA: Diagnosis not present

## 2020-09-08 DIAGNOSIS — Z7984 Long term (current) use of oral hypoglycemic drugs: Secondary | ICD-10-CM | POA: Diagnosis not present

## 2020-09-08 DIAGNOSIS — E1169 Type 2 diabetes mellitus with other specified complication: Secondary | ICD-10-CM | POA: Diagnosis not present

## 2021-06-25 DIAGNOSIS — Z125 Encounter for screening for malignant neoplasm of prostate: Secondary | ICD-10-CM | POA: Diagnosis not present

## 2021-06-25 DIAGNOSIS — N182 Chronic kidney disease, stage 2 (mild): Secondary | ICD-10-CM | POA: Diagnosis not present

## 2021-06-25 DIAGNOSIS — M109 Gout, unspecified: Secondary | ICD-10-CM | POA: Diagnosis not present

## 2021-06-25 DIAGNOSIS — Z0000 Encounter for general adult medical examination without abnormal findings: Secondary | ICD-10-CM | POA: Diagnosis not present

## 2021-06-25 DIAGNOSIS — E1122 Type 2 diabetes mellitus with diabetic chronic kidney disease: Secondary | ICD-10-CM | POA: Diagnosis not present

## 2021-06-25 DIAGNOSIS — G720 Drug-induced myopathy: Secondary | ICD-10-CM | POA: Diagnosis not present

## 2021-06-25 DIAGNOSIS — Z7984 Long term (current) use of oral hypoglycemic drugs: Secondary | ICD-10-CM | POA: Diagnosis not present

## 2021-08-17 DIAGNOSIS — Z7984 Long term (current) use of oral hypoglycemic drugs: Secondary | ICD-10-CM | POA: Diagnosis not present

## 2021-08-17 DIAGNOSIS — E1169 Type 2 diabetes mellitus with other specified complication: Secondary | ICD-10-CM | POA: Diagnosis not present

## 2021-11-27 DIAGNOSIS — H2513 Age-related nuclear cataract, bilateral: Secondary | ICD-10-CM | POA: Diagnosis not present

## 2021-11-27 DIAGNOSIS — H40013 Open angle with borderline findings, low risk, bilateral: Secondary | ICD-10-CM | POA: Diagnosis not present

## 2021-11-27 DIAGNOSIS — E119 Type 2 diabetes mellitus without complications: Secondary | ICD-10-CM | POA: Diagnosis not present

## 2021-11-27 DIAGNOSIS — H52223 Regular astigmatism, bilateral: Secondary | ICD-10-CM | POA: Diagnosis not present

## 2021-12-11 DIAGNOSIS — E119 Type 2 diabetes mellitus without complications: Secondary | ICD-10-CM | POA: Diagnosis not present

## 2021-12-11 DIAGNOSIS — H903 Sensorineural hearing loss, bilateral: Secondary | ICD-10-CM | POA: Diagnosis not present

## 2021-12-11 DIAGNOSIS — Z570 Occupational exposure to noise: Secondary | ICD-10-CM | POA: Diagnosis not present

## 2021-12-24 DIAGNOSIS — E1122 Type 2 diabetes mellitus with diabetic chronic kidney disease: Secondary | ICD-10-CM | POA: Diagnosis not present

## 2021-12-24 DIAGNOSIS — G720 Drug-induced myopathy: Secondary | ICD-10-CM | POA: Diagnosis not present

## 2021-12-24 DIAGNOSIS — R030 Elevated blood-pressure reading, without diagnosis of hypertension: Secondary | ICD-10-CM | POA: Diagnosis not present

## 2021-12-24 DIAGNOSIS — E7800 Pure hypercholesterolemia, unspecified: Secondary | ICD-10-CM | POA: Diagnosis not present

## 2022-03-14 DIAGNOSIS — H2513 Age-related nuclear cataract, bilateral: Secondary | ICD-10-CM | POA: Diagnosis not present

## 2022-03-14 DIAGNOSIS — H40013 Open angle with borderline findings, low risk, bilateral: Secondary | ICD-10-CM | POA: Diagnosis not present

## 2022-08-05 DIAGNOSIS — G720 Drug-induced myopathy: Secondary | ICD-10-CM | POA: Diagnosis not present

## 2022-08-05 DIAGNOSIS — Z1211 Encounter for screening for malignant neoplasm of colon: Secondary | ICD-10-CM | POA: Diagnosis not present

## 2022-08-05 DIAGNOSIS — N1831 Chronic kidney disease, stage 3a: Secondary | ICD-10-CM | POA: Diagnosis not present

## 2022-08-05 DIAGNOSIS — E7800 Pure hypercholesterolemia, unspecified: Secondary | ICD-10-CM | POA: Diagnosis not present

## 2022-08-05 DIAGNOSIS — Z0000 Encounter for general adult medical examination without abnormal findings: Secondary | ICD-10-CM | POA: Diagnosis not present

## 2022-08-05 DIAGNOSIS — I10 Essential (primary) hypertension: Secondary | ICD-10-CM | POA: Diagnosis not present

## 2022-08-05 DIAGNOSIS — Z23 Encounter for immunization: Secondary | ICD-10-CM | POA: Diagnosis not present

## 2022-08-05 DIAGNOSIS — E1122 Type 2 diabetes mellitus with diabetic chronic kidney disease: Secondary | ICD-10-CM | POA: Diagnosis not present

## 2023-01-20 DIAGNOSIS — D120 Benign neoplasm of cecum: Secondary | ICD-10-CM | POA: Diagnosis not present

## 2023-01-20 DIAGNOSIS — Z86010 Personal history of colonic polyps: Secondary | ICD-10-CM | POA: Diagnosis not present

## 2023-01-20 DIAGNOSIS — Z09 Encounter for follow-up examination after completed treatment for conditions other than malignant neoplasm: Secondary | ICD-10-CM | POA: Diagnosis not present

## 2023-01-20 DIAGNOSIS — K649 Unspecified hemorrhoids: Secondary | ICD-10-CM | POA: Diagnosis not present

## 2023-01-20 DIAGNOSIS — D123 Benign neoplasm of transverse colon: Secondary | ICD-10-CM | POA: Diagnosis not present

## 2023-01-20 DIAGNOSIS — D125 Benign neoplasm of sigmoid colon: Secondary | ICD-10-CM | POA: Diagnosis not present

## 2023-01-22 DIAGNOSIS — D120 Benign neoplasm of cecum: Secondary | ICD-10-CM | POA: Diagnosis not present

## 2023-01-22 DIAGNOSIS — D123 Benign neoplasm of transverse colon: Secondary | ICD-10-CM | POA: Diagnosis not present

## 2023-01-22 DIAGNOSIS — D125 Benign neoplasm of sigmoid colon: Secondary | ICD-10-CM | POA: Diagnosis not present

## 2023-02-04 DIAGNOSIS — N1831 Chronic kidney disease, stage 3a: Secondary | ICD-10-CM | POA: Diagnosis not present

## 2023-02-04 DIAGNOSIS — E663 Overweight: Secondary | ICD-10-CM | POA: Diagnosis not present

## 2023-02-04 DIAGNOSIS — I10 Essential (primary) hypertension: Secondary | ICD-10-CM | POA: Diagnosis not present

## 2023-02-04 DIAGNOSIS — E7800 Pure hypercholesterolemia, unspecified: Secondary | ICD-10-CM | POA: Diagnosis not present

## 2023-02-04 DIAGNOSIS — G720 Drug-induced myopathy: Secondary | ICD-10-CM | POA: Diagnosis not present

## 2023-02-04 DIAGNOSIS — E1122 Type 2 diabetes mellitus with diabetic chronic kidney disease: Secondary | ICD-10-CM | POA: Diagnosis not present

## 2023-02-26 DIAGNOSIS — E119 Type 2 diabetes mellitus without complications: Secondary | ICD-10-CM | POA: Diagnosis not present

## 2023-02-26 DIAGNOSIS — H40053 Ocular hypertension, bilateral: Secondary | ICD-10-CM | POA: Diagnosis not present

## 2023-02-26 DIAGNOSIS — H2513 Age-related nuclear cataract, bilateral: Secondary | ICD-10-CM | POA: Diagnosis not present

## 2023-03-08 DIAGNOSIS — S30860A Insect bite (nonvenomous) of lower back and pelvis, initial encounter: Secondary | ICD-10-CM | POA: Diagnosis not present

## 2023-03-08 DIAGNOSIS — A6920 Lyme disease, unspecified: Secondary | ICD-10-CM | POA: Diagnosis not present

## 2023-08-12 DIAGNOSIS — N1831 Chronic kidney disease, stage 3a: Secondary | ICD-10-CM | POA: Diagnosis not present

## 2023-08-12 DIAGNOSIS — E1169 Type 2 diabetes mellitus with other specified complication: Secondary | ICD-10-CM | POA: Diagnosis not present

## 2023-08-12 DIAGNOSIS — E1122 Type 2 diabetes mellitus with diabetic chronic kidney disease: Secondary | ICD-10-CM | POA: Diagnosis not present

## 2023-08-12 DIAGNOSIS — G720 Drug-induced myopathy: Secondary | ICD-10-CM | POA: Diagnosis not present

## 2023-08-12 DIAGNOSIS — Z23 Encounter for immunization: Secondary | ICD-10-CM | POA: Diagnosis not present

## 2023-08-12 DIAGNOSIS — E7800 Pure hypercholesterolemia, unspecified: Secondary | ICD-10-CM | POA: Diagnosis not present

## 2023-08-12 DIAGNOSIS — I10 Essential (primary) hypertension: Secondary | ICD-10-CM | POA: Diagnosis not present

## 2023-08-12 DIAGNOSIS — Z0000 Encounter for general adult medical examination without abnormal findings: Secondary | ICD-10-CM | POA: Diagnosis not present

## 2023-08-12 DIAGNOSIS — Z125 Encounter for screening for malignant neoplasm of prostate: Secondary | ICD-10-CM | POA: Diagnosis not present

## 2023-08-12 DIAGNOSIS — E1165 Type 2 diabetes mellitus with hyperglycemia: Secondary | ICD-10-CM | POA: Diagnosis not present

## 2023-08-12 DIAGNOSIS — J069 Acute upper respiratory infection, unspecified: Secondary | ICD-10-CM | POA: Diagnosis not present

## 2024-02-10 DIAGNOSIS — R079 Chest pain, unspecified: Secondary | ICD-10-CM | POA: Diagnosis not present

## 2024-02-10 DIAGNOSIS — E1122 Type 2 diabetes mellitus with diabetic chronic kidney disease: Secondary | ICD-10-CM | POA: Diagnosis not present

## 2024-02-10 DIAGNOSIS — E1165 Type 2 diabetes mellitus with hyperglycemia: Secondary | ICD-10-CM | POA: Diagnosis not present

## 2024-02-10 DIAGNOSIS — I10 Essential (primary) hypertension: Secondary | ICD-10-CM | POA: Diagnosis not present

## 2024-02-10 DIAGNOSIS — E7800 Pure hypercholesterolemia, unspecified: Secondary | ICD-10-CM | POA: Diagnosis not present

## 2024-02-10 DIAGNOSIS — N1831 Chronic kidney disease, stage 3a: Secondary | ICD-10-CM | POA: Diagnosis not present

## 2024-02-10 DIAGNOSIS — G720 Drug-induced myopathy: Secondary | ICD-10-CM | POA: Diagnosis not present

## 2024-02-10 DIAGNOSIS — E1169 Type 2 diabetes mellitus with other specified complication: Secondary | ICD-10-CM | POA: Diagnosis not present

## 2024-02-20 DIAGNOSIS — R079 Chest pain, unspecified: Secondary | ICD-10-CM | POA: Diagnosis not present

## 2024-02-24 DIAGNOSIS — E1169 Type 2 diabetes mellitus with other specified complication: Secondary | ICD-10-CM | POA: Diagnosis not present

## 2024-02-24 DIAGNOSIS — K219 Gastro-esophageal reflux disease without esophagitis: Secondary | ICD-10-CM | POA: Diagnosis not present

## 2024-02-24 DIAGNOSIS — R0789 Other chest pain: Secondary | ICD-10-CM | POA: Diagnosis not present

## 2024-03-08 DIAGNOSIS — R079 Chest pain, unspecified: Secondary | ICD-10-CM | POA: Diagnosis not present

## 2024-05-03 DIAGNOSIS — H52223 Regular astigmatism, bilateral: Secondary | ICD-10-CM | POA: Diagnosis not present

## 2024-05-03 DIAGNOSIS — H5201 Hypermetropia, right eye: Secondary | ICD-10-CM | POA: Diagnosis not present

## 2024-05-03 DIAGNOSIS — H524 Presbyopia: Secondary | ICD-10-CM | POA: Diagnosis not present

## 2024-05-03 DIAGNOSIS — H40053 Ocular hypertension, bilateral: Secondary | ICD-10-CM | POA: Diagnosis not present

## 2024-05-03 DIAGNOSIS — E119 Type 2 diabetes mellitus without complications: Secondary | ICD-10-CM | POA: Diagnosis not present

## 2024-05-03 DIAGNOSIS — H2513 Age-related nuclear cataract, bilateral: Secondary | ICD-10-CM | POA: Diagnosis not present

## 2024-08-25 DIAGNOSIS — E7800 Pure hypercholesterolemia, unspecified: Secondary | ICD-10-CM | POA: Diagnosis not present

## 2024-08-25 DIAGNOSIS — Z0000 Encounter for general adult medical examination without abnormal findings: Secondary | ICD-10-CM | POA: Diagnosis not present

## 2024-08-25 DIAGNOSIS — E1169 Type 2 diabetes mellitus with other specified complication: Secondary | ICD-10-CM | POA: Diagnosis not present

## 2024-08-25 DIAGNOSIS — Z5181 Encounter for therapeutic drug level monitoring: Secondary | ICD-10-CM | POA: Diagnosis not present

## 2024-08-25 DIAGNOSIS — R251 Tremor, unspecified: Secondary | ICD-10-CM | POA: Diagnosis not present

## 2024-08-25 DIAGNOSIS — Z125 Encounter for screening for malignant neoplasm of prostate: Secondary | ICD-10-CM | POA: Diagnosis not present

## 2024-08-25 DIAGNOSIS — Z23 Encounter for immunization: Secondary | ICD-10-CM | POA: Diagnosis not present

## 2024-08-25 DIAGNOSIS — N529 Male erectile dysfunction, unspecified: Secondary | ICD-10-CM | POA: Diagnosis not present

## 2024-08-25 DIAGNOSIS — Z1331 Encounter for screening for depression: Secondary | ICD-10-CM | POA: Diagnosis not present
